# Patient Record
Sex: Female | Born: 1975 | Race: White | Hispanic: No | Smoking: Current every day smoker
Health system: Southern US, Community
[De-identification: ages and names within clinical notes are randomized; demographics above are authoritative.]

## PROBLEM LIST (undated history)

## (undated) DIAGNOSIS — E0590 Thyrotoxicosis, unspecified without thyrotoxic crisis or storm: Secondary | ICD-10-CM

## (undated) DIAGNOSIS — I341 Nonrheumatic mitral (valve) prolapse: Secondary | ICD-10-CM

## (undated) DIAGNOSIS — K859 Acute pancreatitis without necrosis or infection, unspecified: Secondary | ICD-10-CM

## (undated) HISTORY — PX: PARTIAL HYSTERECTOMY: SHX80

## (undated) HISTORY — PX: CHOLECYSTECTOMY: SHX55

---

## 2015-02-05 ENCOUNTER — Encounter (HOSPITAL_COMMUNITY): Payer: Self-pay

## 2015-02-05 ENCOUNTER — Emergency Department (HOSPITAL_COMMUNITY): Payer: Self-pay

## 2015-02-05 ENCOUNTER — Emergency Department (HOSPITAL_COMMUNITY)
Admission: EM | Admit: 2015-02-05 | Discharge: 2015-02-05 | Disposition: A | Discharge status: Home / Self Care | Payer: Self-pay | Attending: Emergency Medicine | Admitting: Emergency Medicine

## 2015-02-05 DIAGNOSIS — Y9289 Other specified places as the place of occurrence of the external cause: Secondary | ICD-10-CM | POA: Insufficient documentation

## 2015-02-05 DIAGNOSIS — Y9389 Activity, other specified: Secondary | ICD-10-CM | POA: Insufficient documentation

## 2015-02-05 DIAGNOSIS — Z720 Tobacco use: Secondary | ICD-10-CM | POA: Insufficient documentation

## 2015-02-05 DIAGNOSIS — E0590 Thyrotoxicosis, unspecified without thyrotoxic crisis or storm: Secondary | ICD-10-CM | POA: Insufficient documentation

## 2015-02-05 DIAGNOSIS — S0990XA Unspecified injury of head, initial encounter: Secondary | ICD-10-CM | POA: Insufficient documentation

## 2015-02-05 DIAGNOSIS — Z79899 Other long term (current) drug therapy: Secondary | ICD-10-CM | POA: Insufficient documentation

## 2015-02-05 DIAGNOSIS — Y998 Other external cause status: Secondary | ICD-10-CM | POA: Insufficient documentation

## 2015-02-05 DIAGNOSIS — M79602 Pain in left arm: Secondary | ICD-10-CM

## 2015-02-05 DIAGNOSIS — I341 Nonrheumatic mitral (valve) prolapse: Secondary | ICD-10-CM | POA: Insufficient documentation

## 2015-02-05 DIAGNOSIS — W19XXXA Unspecified fall, initial encounter: Secondary | ICD-10-CM

## 2015-02-05 DIAGNOSIS — W010XXA Fall on same level from slipping, tripping and stumbling without subsequent striking against object, initial encounter: Secondary | ICD-10-CM | POA: Insufficient documentation

## 2015-02-05 DIAGNOSIS — S199XXA Unspecified injury of neck, initial encounter: Secondary | ICD-10-CM | POA: Insufficient documentation

## 2015-02-05 DIAGNOSIS — S4991XA Unspecified injury of right shoulder and upper arm, initial encounter: Secondary | ICD-10-CM | POA: Insufficient documentation

## 2015-02-05 DIAGNOSIS — M542 Cervicalgia: Secondary | ICD-10-CM

## 2015-02-05 DIAGNOSIS — Z8719 Personal history of other diseases of the digestive system: Secondary | ICD-10-CM | POA: Insufficient documentation

## 2015-02-05 HISTORY — DX: Nonrheumatic mitral (valve) prolapse: I34.1

## 2015-02-05 HISTORY — DX: Acute pancreatitis without necrosis or infection, unspecified: K85.90

## 2015-02-05 HISTORY — DX: Thyrotoxicosis, unspecified without thyrotoxic crisis or storm: E05.90

## 2015-02-05 MED ORDER — HYDROCODONE-ACETAMINOPHEN 5-325 MG PO TABS
1.0000 | ORAL_TABLET | Freq: Four times a day (QID) | ORAL | Status: AC | PRN
Start: 2015-02-05 — End: ?

## 2015-02-05 MED ORDER — CYCLOBENZAPRINE HCL 10 MG PO TABS: 10.0000 mg | ORAL_TABLET | Freq: Two times a day (BID) | ORAL | Status: AC | PRN

## 2015-02-05 MED ORDER — NAPROXEN 500 MG PO TABS: 500.0000 mg | ORAL_TABLET | Freq: Two times a day (BID) | ORAL | Status: AC

## 2015-02-05 NOTE — ED Notes (Signed)
Pt c/o bilateral arm and posterior neck pain after a slip and fall x 2 days ago.  Pain score 8/10.  Pt reports numbness and tingling in bilateral hands.  Pt reports taking OTC medication w/o relief.

## 2015-02-05 NOTE — Discharge Instructions (Signed)
Results of the CT scans and x-rays provided to you. Make an appointment to follow-up with neurosurgery. Take the Naprosyn on a regular basis. Take Flexeril as directed. Take hydrocodone as needed. Return for any new or worse symptoms.

## 2015-02-05 NOTE — ED Notes (Signed)
Pt reported slipping and falling 2 days ago and having increase generalized pain. Pt denies hitting head, LOC, nausea and visual disturbances. Pt reported lightheadedness at the time but denies lightheadedness at this time. (+)PMS, CRT brisk, no deformity/bruising/swelling noted to BUE/BLE.

## 2015-02-05 NOTE — ED Provider Notes (Signed)
CSN: [id number]     Arrival date & time 02/05/15  4098 History   First MD Initiated Contact with Patient 02/05/15 [phone]     Chief Complaint  Patient presents with  . Fall  . Arm Pain  . Neck Pain     (Consider location/radiation/quality/duration/timing/severity/associated sxs/prior Treatment) Patient is a 39 y.o. female presenting with fall, arm pain, and neck pain. The history is provided by the patient.  Fall Associated symptoms include headaches. Pertinent negatives include no chest pain, no abdominal pain and no shortness of breath.  Arm Pain Associated symptoms include headaches. Pertinent negatives include no chest pain, no abdominal pain and no shortness of breath.  Neck Pain Associated symptoms: headaches, numbness and weakness   Associated symptoms: no chest pain and no fever    patient with fall 2 days ago slipped on concrete landing on left side denies hitting head or any loss of consciousness. No nausea vomiting no visual disturbances. Patient reported lightheadedness initially but denies any now. Patient stating that she's got tingling in her right arm even though she fell on the left side pain to left forearm and arm and some in the shoulder. Also now describing neck pain and a headache describes numbness to the right fingers and states that they don't feel quite right as far as their strength. No lower extremity symptoms. No chest pain no shortness of breath no abdominal pain. No back pain. Patient recently moved to the area from [place].  Past Medical History  Diagnosis Date  . Mitral valve prolapse   . Hyperthyroidism   . Pancreatitis    Past Surgical History  Procedure Laterality Date  . Cholecystectomy    . Cesarean section    . Partial hysterectomy     History reviewed. No pertinent family history. History  Substance Use Topics  . Smoking status: Current Every Day Smoker -- 1.00 packs/day    Types: Cigarettes  . Smokeless tobacco: Not on file  . Alcohol Use:  No   OB History    No data available     Review of Systems  Constitutional: Negative for fever.  HENT: Negative for congestion.   Eyes: Negative for visual disturbance.  Respiratory: Negative for shortness of breath.   Cardiovascular: Negative for chest pain.  Gastrointestinal: Negative for abdominal pain.  Genitourinary: Negative for dysuria.  Musculoskeletal: Positive for neck pain.  Skin: Negative for wound.  Neurological: Positive for weakness, numbness and headaches.      Allergies  Zantac and Sulfa antibiotics  Home Medications   Prior to Admission medications   Medication Sig Start Date End Date Taking? Authorizing Provider  Acetaminophen-Aspirin [place] (EXCEDRIN BACK & BODY PO) Take 2 tablets by mouth 3 (three) times daily as needed (pain).   Yes Historical Provider, MD  buprenorphine-naloxone (SUBOXONE) 8-2 MG SUBL SL tablet Place 1 tablet under the tongue daily.   Yes Historical Provider, MD  gabapentin (NEURONTIN) 100 MG capsule Take 200 mg by mouth 3 (three) times daily.   Yes Historical Provider, MD  levothyroxine (SYNTHROID, LEVOTHROID) 137 MCG tablet Take 137 mcg by mouth daily before breakfast.   Yes Historical Provider, MD  propranolol (INDERAL) 40 MG tablet Take 40 mg by mouth 2 (two) times daily.   Yes Historical Provider, MD  ranitidine (ZANTAC) 150 MG tablet Take 150 mg by mouth daily as needed for heartburn.   Yes Historical Provider, MD  cyclobenzaprine (FLEXERIL) 10 MG tablet Take 1 tablet (10 mg total) by mouth 2 (two) times  daily as needed for muscle spasms. 02/05/15   Vanetta Mulders, MD  HYDROcodone-acetaminophen (NORCO/VICODIN) 5-325 MG per tablet Take 1-2 tablets by mouth every 6 (six) hours as needed for moderate pain. 02/05/15   Vanetta Mulders, MD  naproxen (NAPROSYN) 500 MG tablet Take 1 tablet (500 mg total) by mouth 2 (two) times daily. 02/05/15   Vanetta Mulders, MD   BP 97/65 mmHg  Pulse 58  Temp(Src) 98 F (36.7 C) (Oral)  Resp 18  SpO2  100% Physical Exam  Constitutional: She is oriented to person, place, and time. She appears well-developed and well-nourished. No distress.  HENT:  Head: Normocephalic and atraumatic.  Mouth/Throat: Oropharynx is clear and moist.  Eyes: Conjunctivae and EOM are normal. Pupils are equal, round, and reactive to light.  Neck: Normal range of motion. Neck supple.  Cardiovascular: Normal rate and normal heart sounds.   No murmur heard. Pulmonary/Chest: Effort normal and breath sounds normal. No respiratory distress.  Abdominal: Soft. Bowel sounds are normal. There is no tenderness.  Musculoskeletal: Normal range of motion. She exhibits edema and tenderness.  No mild swelling to the left wrist some tenderness to palpation to left humerus left forearm no snuffbox tenderness.  Right hand with good motor function left hand with good motor function. Patient describing tingling to the right hand however no evidence of any motor sensory deficit.  Neurological: She is alert and oriented to person, place, and time. No cranial nerve deficit. She exhibits normal muscle tone. Coordination normal.  Skin: Skin is warm. No rash noted.  Nursing note and vitals reviewed.   ED Course  Procedures (including critical care time) Labs Review Labs Reviewed - No data to display  Imaging Review Dg Forearm Left  02/05/2015   CLINICAL DATA:  Pt reports she slipped and fell at work on her left side x2 days ago. Pt reports pain that radiates from distal end of left metacarpals, up left arm and left shoulder. Pt reports its radiating around the back of her neck and down her rt arm into her rt hand. Pt also reports swelling in right hand  EXAM: LEFT FOREARM - 2 VIEW  COMPARISON:  None.  FINDINGS: No fracture. No bone lesion. Wrist and elbow joints are normally aligned. Soft tissues are unremarkable.  IMPRESSION: Negative.   Electronically Signed   By: Amie Portland M.D.   On: 02/05/2015 11:17   Dg Wrist Complete  Left  02/05/2015   CLINICAL DATA:  Pt reports she slipped and fell at work on her left side x2 days ago. Pt reports pain that radiates from distal end of left metacarpals, up left arm and left shoulder. Pt reports its radiating around the back of her neck and down her rt arm into her rt hand. Pt also reports swelling in right hand.  EXAM: LEFT WRIST - COMPLETE 3+ VIEW  COMPARISON:  None.  FINDINGS: There is no evidence of fracture or dislocation. There is no evidence of arthropathy or other focal bone abnormality. Soft tissues are unremarkable.  IMPRESSION: Negative.   Electronically Signed   By: Amie Portland M.D.   On: 02/05/2015 11:28   Ct Head Wo Contrast  02/05/2015   CLINICAL DATA:  Bilateral arm, posterior cervical spine pain after slipped and fell 2 days ago, numbness and tingling in both can't  EXAM: CT HEAD WITHOUT CONTRAST  CT CERVICAL SPINE WITHOUT CONTRAST  TECHNIQUE: Multidetector CT imaging of the head and cervical spine was performed following the standard protocol without intravenous  contrast. Multiplanar CT image reconstructions of the cervical spine were also generated.  COMPARISON:  None.  FINDINGS: CT HEAD FINDINGS  No mass lesion. No midline shift. No acute hemorrhage or hematoma. No extra-axial fluid collections. No evidence of acute infarction. No skull fracture.  CT CERVICAL SPINE FINDINGS  Normal alignment with no paraspinous hematoma. There is moderate degenerative disc disease at C5-6 with a disc bulge causing mild canal narrowing. There is moderate degenerative disc disease at C6-7 with a disc bulge causing mild canal narrowing. At C7, the bilateral pedicles demonstrate vertical fractures. The margins of the fractures appear at least somewhat sclerotic. There are small cervical ribs bilaterally at C7 as well.  There is generalized reversed lordosis. No acute soft tissue abnormalities. Lung apices clear.  IMPRESSION: 1. Negative head CT 2. Multilevel cervical spine canal narrowing due  to degenerative disc disease and disc bulges. 3. Fractures of the bilateral C7 pedicles age uncertain. There is at least some degree of sclerosis involving the margins of the fracture suggesting that fractures may not necessarily be acute.   Electronically Signed   By: Esperanza Heir M.D.   On: 02/05/2015 11:12   Ct Cervical Spine Wo Contrast  02/05/2015   CLINICAL DATA:  Bilateral arm, posterior cervical spine pain after slipped and fell 2 days ago, numbness and tingling in both can't  EXAM: CT HEAD WITHOUT CONTRAST  CT CERVICAL SPINE WITHOUT CONTRAST  TECHNIQUE: Multidetector CT imaging of the head and cervical spine was performed following the standard protocol without intravenous contrast. Multiplanar CT image reconstructions of the cervical spine were also generated.  COMPARISON:  None.  FINDINGS: CT HEAD FINDINGS  No mass lesion. No midline shift. No acute hemorrhage or hematoma. No extra-axial fluid collections. No evidence of acute infarction. No skull fracture.  CT CERVICAL SPINE FINDINGS  Normal alignment with no paraspinous hematoma. There is moderate degenerative disc disease at C5-6 with a disc bulge causing mild canal narrowing. There is moderate degenerative disc disease at C6-7 with a disc bulge causing mild canal narrowing. At C7, the bilateral pedicles demonstrate vertical fractures. The margins of the fractures appear at least somewhat sclerotic. There are small cervical ribs bilaterally at C7 as well.  There is generalized reversed lordosis. No acute soft tissue abnormalities. Lung apices clear.  IMPRESSION: 1. Negative head CT 2. Multilevel cervical spine canal narrowing due to degenerative disc disease and disc bulges. 3. Fractures of the bilateral C7 pedicles age uncertain. There is at least some degree of sclerosis involving the margins of the fracture suggesting that fractures may not necessarily be acute.   Electronically Signed   By: Esperanza Heir M.D.   On: 02/05/2015 11:12   Dg  Humerus Left  02/05/2015   CLINICAL DATA:  Pt reports she slipped and fell at work on her left side x2 days ago. Pt reports pain that radiates from distal end of left metacarpals, up left arm and left shoulder. Pt reports its radiating around the back of her neck and down her rt arm into her rt hand. Pt also reports swelling in right hand.  EXAM: LEFT HUMERUS - 2+ VIEW  COMPARISON:  None.  FINDINGS: There is no evidence of fracture or other focal bone lesions. Soft tissues are unremarkable.  IMPRESSION: Negative.   Electronically Signed   By: Amie Portland M.D.   On: 02/05/2015 11:13     EKG Interpretation None      MDM   Final diagnoses:  Fall  Arm  pain, diffuse, left  Neck pain    Patient knew to the area on initial presentation seemed to maybe have some slurred speech. Some concern whether there may be a narcotic problem. Patient's workup shows evidence of remote C7 fracture patient doesn't really have a history that seems to fit into that. Other acute injuries predominantly surrounding the left arm without any significant findings. There was no snuffbox tenderness. Patient talked about some neurological tingling to the right hand however motor is completely intact and sensation seems to be intact. Patient given referral to neurosurgery for further evaluation will be treated symptomatically. Patient not showing any evidence of a cord syndrome.    Vanetta Mulders, MD 02/05/15 1239

## 2015-06-14 ENCOUNTER — Emergency Department (HOSPITAL_COMMUNITY): Payer: Managed Care, Other (non HMO)

## 2015-06-14 ENCOUNTER — Emergency Department (HOSPITAL_COMMUNITY)
Admission: EM | Admit: 2015-06-14 | Discharge: 2015-06-14 | Disposition: A | Discharge status: Home / Self Care | Payer: Managed Care, Other (non HMO) | Attending: Emergency Medicine | Admitting: Emergency Medicine

## 2015-06-14 ENCOUNTER — Encounter (HOSPITAL_COMMUNITY): Payer: Self-pay

## 2015-06-14 DIAGNOSIS — R0602 Shortness of breath: Secondary | ICD-10-CM | POA: Diagnosis not present

## 2015-06-14 DIAGNOSIS — F419 Anxiety disorder, unspecified: Secondary | ICD-10-CM | POA: Diagnosis not present

## 2015-06-14 DIAGNOSIS — F17210 Nicotine dependence, cigarettes, uncomplicated: Secondary | ICD-10-CM | POA: Diagnosis not present

## 2015-06-14 DIAGNOSIS — Z8719 Personal history of other diseases of the digestive system: Secondary | ICD-10-CM | POA: Insufficient documentation

## 2015-06-14 DIAGNOSIS — R002 Palpitations: Secondary | ICD-10-CM | POA: Diagnosis not present

## 2015-06-14 DIAGNOSIS — Z791 Long term (current) use of non-steroidal anti-inflammatories (NSAID): Secondary | ICD-10-CM | POA: Diagnosis not present

## 2015-06-14 DIAGNOSIS — Z8679 Personal history of other diseases of the circulatory system: Secondary | ICD-10-CM | POA: Insufficient documentation

## 2015-06-14 DIAGNOSIS — R079 Chest pain, unspecified: Secondary | ICD-10-CM

## 2015-06-14 DIAGNOSIS — E0590 Thyrotoxicosis, unspecified without thyrotoxic crisis or storm: Secondary | ICD-10-CM | POA: Insufficient documentation

## 2015-06-14 DIAGNOSIS — Z79899 Other long term (current) drug therapy: Secondary | ICD-10-CM | POA: Insufficient documentation

## 2015-06-14 DIAGNOSIS — R42 Dizziness and giddiness: Secondary | ICD-10-CM | POA: Insufficient documentation

## 2015-06-14 DIAGNOSIS — R110 Nausea: Secondary | ICD-10-CM | POA: Insufficient documentation

## 2015-06-14 DIAGNOSIS — Z79891 Long term (current) use of opiate analgesic: Secondary | ICD-10-CM | POA: Insufficient documentation

## 2015-06-14 LAB — BASIC METABOLIC PANEL
ANION GAP: 7 (ref 5–15)
BUN: 10 mg/dL (ref 6–20)
CALCIUM: 9.6 mg/dL (ref 8.9–10.3)
CO2: 25 mmol/L (ref 22–32)
Chloride: 107 mmol/L (ref 101–111)
Creatinine, Ser: 0.61 mg/dL (ref 0.44–1.00)
GFR calc non Af Amer: >60 mL/min (ref >60)
Glucose, Bld: 128 mg/dL — ABNORMAL HIGH (ref 65–99)
Potassium: 3.7 mmol/L (ref 3.5–5.1)
SODIUM: 139 mmol/L (ref 135–145)

## 2015-06-14 LAB — CBC
HEMATOCRIT: 39.9 % (ref 36.0–46.0)
Hemoglobin: 13.5 g/dL (ref 12.0–15.0)
MCH: 32.5 pg (ref 26.0–34.0)
MCHC: 33.8 g/dL (ref 30.0–36.0)
MCV: 95.9 fL (ref 78.0–100.0)
Platelets: 242 10*3/uL (ref 150–400)
RBC: 4.16 MIL/uL (ref 3.87–5.11)
RDW: 12.3 % (ref 11.5–15.5)
WBC: 8.9 10*3/uL (ref 4.0–10.5)

## 2015-06-14 LAB — I-STAT TROPONIN, ED: Troponin i, poc: 0 ng/mL (ref 0.00–0.08)

## 2015-06-14 LAB — D-DIMER, QUANTITATIVE (NOT AT ARMC): D DIMER QUANT: 0.43 ug{FEU}/mL (ref 0.00–0.50)

## 2015-06-14 NOTE — Discharge Instructions (Signed)
Please read and follow all provided instructions.  Your diagnoses today include:  1. Chest pain, unspecified chest pain type    Tests performed today include:  An EKG of your heart  A chest x-ray  Cardiac enzymes - a blood test for heart muscle damage  Blood counts and electrolytes  D-dimer - screening test for blood clot and this was normal  Vital signs. See below for your results today.   Medications prescribed:   None  Take any prescribed medications only as directed.  Follow-up instructions: Please follow-up with your primary care provider as soon as you can for further evaluation of your symptoms.   Return instructions:  SEEK IMMEDIATE MEDICAL ATTENTION IF:  You have severe chest pain, especially if the pain is crushing or pressure-like and spreads to the arms, back, neck, or jaw, or if you have sweating, nausea (feeling sick to your stomach), or shortness of breath. THIS IS AN EMERGENCY. Don't wait to see if the pain will go away. Get medical help at once. Call 911 or 0 (operator). DO NOT drive yourself to the hospital.   Your chest pain gets worse and does not go away with rest.   You have an attack of chest pain lasting longer than usual, despite rest and treatment with the medications your caregiver has prescribed.   You wake from sleep with chest pain or shortness of breath.  You feel dizzy or faint.  You have chest pain not typical of your usual pain for which you originally saw your caregiver.   You have any other emergent concerns regarding your health.  Additional Information: Chest pain comes from many different causes. Your caregiver has diagnosed you as having chest pain that is not specific for one problem, but does not require admission.  You are at low risk for an acute heart condition or other serious illness.   Your vital signs today were: BP 103/69 mmHg   Pulse 58   Temp(Src) 98 F (36.7 C) (Oral)   Resp 16   SpO2 100% If your blood pressure  (BP) was elevated above 135/85 this visit, please have this repeated by your doctor within one month. --------------   Emergency Department Resource Guide 1) Find a Doctor and Pay Out of Pocket Although you won't have to find out who is covered by your insurance plan, it is a good idea to ask around and get recommendations. You will then need to call the office and see if the doctor you have chosen will accept you as a new patient and what types of options they offer for patients who are self-pay. Some doctors offer discounts or will set up payment plans for their patients who do not have insurance, but you will need to ask so you aren't surprised when you get to your appointment.  2) Contact Your Local Health Department Not all health departments have doctors that can see patients for sick visits, but many do, so it is worth a call to see if yours does. If you don't know where your local health department is, you can check in your phone book. The CDC also has a tool to help you locate your state's health department, and many state websites also have listings of all of their local health departments.  3) Find a Walk-in Clinic If your illness is not likely to be very severe or complicated, you may want to try a walk in clinic. These are popping up all over the country in pharmacies, drugstores,  and shopping centers. They're usually staffed by nurse practitioners or physician assistants that have been trained to treat common illnesses and complaints. They're usually fairly quick and inexpensive. However, if you have serious medical issues or chronic medical problems, these are probably not your best option.  No Primary Care Doctor: - Call Health Connect at  [phone] - they can help you locate a primary care doctor that  accepts your insurance, provides certain services, etc. - Physician Referral Service- [phone]  Chronic Pain Problems: Organization         Address  Phone   Notes  [facility]  [phone] Patients need to be referred by their primary care doctor.   Medication Assistance: Organization         Address  Phone   Notes  [facility] Med[facility] [address] [place] [place], [place] [address] [phone] --Must be a resident of [facility] -- Must have NO insurance coverage whatsoever (no Medicaid/ Medicare, etc.) -- The pt. MUST have a primary care doctor that directs their care regularly and follows them in the community   MedAssist  [phone]   [employer]  [phone]    Agencies that provide inexpensive medical care: Organization         Address  Phone   Notes  [facility]  [phone]   [facility]    [phone]   [facility] [address] [place], [place] [address] [phone]   [facility] [address] [place] [place] [phone]   Planned Parenthood    [phone]   [facility]    [phone]   [facility] and [facility]  [address], [place] Phone:  [phone], Fax:  [phone] Hours of Operation:  9 am - 6 pm, M-F.  Also accepts Medicaid/Medicare and self-pay.  [facility]  [address], [place] Phone: [phone], Fax: [phone]. Hours of Operation:  8:30 am - 5:30 pm, M-F.  Also accepts Medicaid and self-pay.  [facility] [place] [address], [place] Phone: [phone]   [facility] [address] Natasha Bence [place], [place] [phone], Ext. [phone] Mondays & Thursdays: 7-9 AM.  First 15 patients are seen on a first come, first serve basis.    Medicaid-accepting [facility] Providers:  Organization         Address  Phone   Notes  [facility] [address], [place] [phone] Also accepts self-pay patients.  [facility] [address]  Laurell Josephs [place], [place]  [phone]   [facility] [address], [place] [phone]   [facility] [address] [place] [phone]   Renaye Rakers [address], [place]   [phone] Only accepts [place] Access [place] patients after they have their name applied to their card.   Self-Pay (no insurance) in [facility]:  Organization         Address  Phone   Notes  Sickle Cell Patients, [facility] [address] [place], [place] [phone]   [facility] [address] [place], [place] [phone]   [facility] [place]  [address], [place] [phone])  [phone]   [facility]/Dr. Julio Sicks  [address] [place] or [address], [place] [phone] Phone number for both [place] and [place] locations is the same.  [facility] [address] [place] [phone]   [facility] [address], [place] or [address] [phone]   [facility] [address] [place] [phone], phone; [phone], fax Sees patients 1st and 3rd Saturday of every month.  Must not qualify for public or private insurance (i.e. Medicaid, Medicare, [place] Health Choice, Veterans' Benefits)  Household income should be no more than 200% of the poverty level The clinic cannot treat you if you are pregnant or think you are pregnant  Sexually transmitted diseases are not treated at the clinic.    Dental Care: Organization         Address  Phone  Notes  [facility] [address] [place], [place] [phone] Accepts children up to age 28 who are enrolled in [place] or [place] Health Choice; pregnant women with a Medicaid card; and children who have applied for Medicaid  or [place] Health Choice, but were declined, whose parents can pay a reduced fee at time of service.  [facility] Department of [facility]  [address], [place] [phone] Accepts children up to age 32 who are enrolled in [place] or [place] Health Choice; pregnant women with a Medicaid card; and children who have applied for Medicaid or [place] Health Choice, but were declined, whose parents can pay a reduced fee at time of service.  [facility]  [address] [place], [place] [phone] Patients are seen by appointment only. Walk-ins are not accepted. [facility] will see patients 72 years of age and older. Monday - Tuesday (8am-5pm) Most Wednesdays (8:30-5pm) $30 per visit, cash only  [facility] Adult Dental Access PROGRAM  [address], [facility] [phone] Patients are seen by appointment only. Walk-ins are not accepted. [facility] will see patients 41 years of age and older. One Wednesday Evening (Monthly: Volunteer Based).  $30 per visit, cash only  [employer]  [phone] for adults; Children under age 35, call Graduate Pediatric Dentistry at [phone]. Children aged 68-14, please call [phone] to request a pediatric application.  Dental services are provided in all areas of dental care including fillings, crowns and bridges, complete and partial dentures, implants, gum treatment, root canals, and extractions. Preventive care is also provided. Treatment is provided to both adults and children. Patients are selected via a lottery and there is often a waiting list.   [facility] [address] [place]  [phone] [email or web address]   [facility] [address] [place], [place] [phone], Ext. [phone] Second and Fourth Thursday of each month, opens at 6:30 AM; Clinic ends at 9 AM.  Patients are seen on a first-come first-served basis, and a limited number are seen  during each clinic.   [facility]  [address] Ether Griffins [place], [place] [phone]   Eligibility Requirements You must have lived in [place], [place], or [place] counties for at least the last three months.   You cannot be eligible for state or federal sponsored [place], including [employer], [place], or [employer].   You generally cannot be eligible  for healthcare insurance through your employer.    How to apply: Eligibility screenings are held every Tuesday and Wednesday afternoon from 1:00 pm until 4:00 pm. You do not need an appointment for the interview!  [facility] [address], [place], [place] [phone]   [facility]  [phone]   [facility] Department  [phone]   [facility] Health Department  [phone]    Behavioral Health Resources in the Community: Intensive Outpatient Programs Organization         Address  Phone  Notes  [facility] [address] [place], [place] [phone]   [facility] Outpatient [address], [place], [place] [phone]   [facility] [address] [place], [place]  [phone]   [facility] [address],  [place], [place] [phone] or [phone]   Substance Abuse Resources Organization         Address  Phone  Notes  [facility] Drug Services  [phone]   [facility]  [phone]   [facility]  [phone]   Floydene Flock  [phone]   Residential & Outpatient Substance Abuse Program  [phone]   Psychological Services Organization         Address  Phone  Notes  [facility]  [phone]   [facility] Services  [phone]   [facility] [address], [place] [phone] or [phone]    Mobile Crisis Teams Organization         Address  Phone  Notes  [facility],  Mobile Crisis Care Unit  [phone]   [facility]  [address] [place] [employer], [place] [phone]   Doristine Locks [address] [place] [phone]    Self-Help/Support Groups Organization         Address  Phone             Notes  [facility] [place] - variety of support groups  336- I7437963 Call for more information  Narcotics Anonymous (NA), [facility] [address], [employer] [place]  2 meetings at this location   Statistician         Address  Phone  Notes  [facility] [address],    [place]  [phone]   [facility]  [address] [place] [address], [place], [place] [phone]   [facility] [address] [place], [place] [phone] Admissions: 8am-3pm M-F  [facility] [address]    [place], [place] [phone]   [facility] [address] [place], [place], [place] [phone]   [facility] [address],  [place], [place] [phone]   [facility] - Intensive Outpatient [address] Laure[address] Joseph[address], [place], [place] [phone]   [facility] ([facility]iction Recovery [facility] [address] [place]  [place], [place] [phone] or [phone]   [facility] ([facility]) [address] [place], [place] [phone] Accepts Medicaid  [facility] [address]  [place] [phone] Substance Abuse/Addiction Treatment   [facility] Organization         Address  Phone  Notes  [facility]  [phone]   Angie Fava, PhD [address] Ervin Knack [place], [place]   [phone] or [phone]   [facility]   [address] [place], [place] (  [phone]) [phone]   [facility] [address], [place], [place] [phone]) [phone] Insurance/Medicaid/sponsorship through [employer]  [facility] [address]                                    [place], [place] [phone]) [phone] Therapy/tele-psych/case  [facility] [address]   [place], [place] [phone](336) [phone]    Dr. Lolly MustacheArfeen  [phone]   [facility] [place] [facility]  [employer] [facility] 1) [address], [place] 2) [address], [place] 3)  [address], [place] [phone](336) [phone]  [phone](336) [phone]   [facility] Child Abuse Hotline 4170528639(336) [phone] or [phone](336) [phone] (After Hours)

## 2015-06-14 NOTE — ED Notes (Signed)
Pt. BIB EMS for complaint of central CP with radiation to L side. Pt. Endorses nausea/dizziness/diarrhea/chills/HA. Pt. With hx of hypothyroidism and is on beta blocker for mitral valve prolapse. EMS reports no orthostatic changes.   Pt. Given 4 MG zofran, 324 ASA, and 1 Nitro.

## 2015-06-14 NOTE — ED Provider Notes (Signed)
CSN: [id number]     Arrival date & time 06/14/15  1732 History   First MD Initiated Contact with Patient 06/14/15 1801     Chief Complaint  Patient presents with  . Chest Pain     (Consider location/radiation/quality/duration/timing/severity/associated sxs/prior Treatment) HPI Comments: Patient presents with complaint of chest pain. Patient states that she was at home getting ready for work when she suddenly became nauseous, very lightheaded without full syncope and developed chest pain. Pain described as sharp. Pain radiated to her left shoulder and back. It was associated with shortness of breath and feeling of slow heart rate. Patient takes a beta blocker. Symptoms gradually resolved but returned again afterwards. She called EMS who administered Zofran, aspirin, nitroglycerin. Symptoms have eased. Patient denies drug use or alcohol. No fever or cough. Patient denies risk factors for pulmonary embolism including: unilateral leg swelling, history of DVT/PE/other blood clots, use of estrogens, recent immobilizations, recent surgery, recent travel (>4hr segment), malignancy, hemoptysis.     Patient is a 39 y.o. female presenting with chest pain. The history is provided by the patient.  Chest Pain Associated symptoms: nausea, palpitations and shortness of breath   Associated symptoms: no abdominal pain, no back pain, no cough, no diaphoresis, no fever and not vomiting     Past Medical History  Diagnosis Date  . Mitral valve prolapse   . Hyperthyroidism   . Pancreatitis    Past Surgical History  Procedure Laterality Date  . Cholecystectomy    . Cesarean section    . Partial hysterectomy     No family history on file. Social History  Substance Use Topics  . Smoking status: Current Every Day Smoker -- 1.00 packs/day    Types: Cigarettes  . Smokeless tobacco: None  . Alcohol Use: No   OB History    No data available     Review of Systems  Constitutional: Negative for fever and  diaphoresis.  Eyes: Negative for redness.  Respiratory: Positive for shortness of breath. Negative for cough.   Cardiovascular: Positive for chest pain and palpitations. Negative for leg swelling.  Gastrointestinal: Positive for nausea. Negative for vomiting and abdominal pain.  Genitourinary: Negative for dysuria.  Musculoskeletal: Negative for back pain and neck pain.  Skin: Negative for rash.  Neurological: Negative for syncope and light-headedness.  Psychiatric/Behavioral: The patient is nervous/anxious.       Allergies  Zantac and Sulfa antibiotics  Home Medications   Prior to Admission medications   Medication Sig Start Date End Date Taking? Authorizing Provider  Acetaminophen-Aspirin [place] (EXCEDRIN BACK & BODY PO) Take 2 tablets by mouth 3 (three) times daily as needed (pain).    Historical Provider, MD  buprenorphine-naloxone (SUBOXONE) 8-2 MG SUBL SL tablet Place 1 tablet under the tongue daily.    Historical Provider, MD  cyclobenzaprine (FLEXERIL) 10 MG tablet Take 1 tablet (10 mg total) by mouth 2 (two) times daily as needed for muscle spasms. 02/05/15   Vanetta MuldersScott Zackowski, MD  gabapentin (NEURONTIN) 100 MG capsule Take 200 mg by mouth 3 (three) times daily.    Historical Provider, MD  HYDROcodone-acetaminophen (NORCO/VICODIN) 5-325 MG per tablet Take 1-2 tablets by mouth every 6 (six) hours as needed for moderate pain. 02/05/15   Vanetta MuldersScott Zackowski, MD  levothyroxine (SYNTHROID, LEVOTHROID) 137 MCG tablet Take 137 mcg by mouth daily before breakfast.    Historical Provider, MD  naproxen (NAPROSYN) 500 MG tablet Take 1 tablet (500 mg total) by mouth 2 (two) times daily. 02/05/15  Vanetta Mulders, MD  propranolol (INDERAL) 40 MG tablet Take 40 mg by mouth 2 (two) times daily.    Historical Provider, MD  ranitidine (ZANTAC) 150 MG tablet Take 150 mg by mouth daily as needed for heartburn.    Historical Provider, MD   BP 93/65 mmHg  Pulse 60  Temp(Src) 98 F (36.7 C) (Oral)  Resp  19  SpO2 99%   Physical Exam  Constitutional: She appears well-developed and well-nourished.  HENT:  Head: Normocephalic and atraumatic.  Mouth/Throat: Mucous membranes are normal. Mucous membranes are not dry.  Eyes: Conjunctivae are normal.  Neck: Trachea normal and normal range of motion. Neck supple. Normal carotid pulses and no JVD present. No muscular tenderness present. Carotid bruit is not present. No tracheal deviation present.  Cardiovascular: Normal rate, regular rhythm, S1 normal, S2 normal, normal heart sounds and intact distal pulses.  Exam reveals no decreased pulses.   No murmur heard. Pulmonary/Chest: Effort normal. No respiratory distress. She has no wheezes. She exhibits no tenderness.  Abdominal: Soft. Normal aorta and bowel sounds are normal. There is no tenderness. There is no rebound and no guarding.  Musculoskeletal: Normal range of motion.  Neurological: She is alert.  Skin: Skin is warm and dry. She is not diaphoretic. No cyanosis. No pallor.  Psychiatric: She has a normal mood and affect.  Nursing note and vitals reviewed.   ED Course  Procedures (including critical care time) Labs Review Labs Reviewed  BASIC METABOLIC PANEL - Abnormal; Notable for the following:    Glucose, Bld 128 (*)    All other components within normal limits  CBC  D-DIMER, QUANTITATIVE (NOT AT [facility])  Rosezena Sensor, ED    Imaging Review Dg Chest 2 View  06/14/2015  CLINICAL DATA:  Chest pain and dizziness EXAM: CHEST  2 VIEW COMPARISON:  None. FINDINGS: Lungs are clear. Heart size and pulmonary vascularity are normal. No adenopathy. No bone lesions. No pneumothorax. IMPRESSION: No edema or consolidation. Electronically Signed   By: Bretta Bang III M.D.   On: 06/14/2015 18:05   I have personally reviewed and evaluated these images and lab results as part of my medical decision-making.   EKG Interpretation   Date/Time:  Wednesday June 14 2015 17:37:15  EST Ventricular Rate:  61 PR Interval:  171 QRS Duration: 113 QT Interval:  413 QTC Calculation: 416 R Axis:   49 Text Interpretation:  Sinus rhythm Borderline intraventricular conduction  delay RSR' in V1 or V2, probably normal variant Baseline wander in lead(s)  III No previous ECGs available Confirmed by Bebe Shaggy  MD, DONALD ([id number])  on 06/14/2015 5:53:04 PM       6:46 PM Patient seen and examined. Work-up initiated. Medications ordered.   Vital signs reviewed and are as follows: BP 93/65 mmHg  Pulse 60  Temp(Src) 98 F (36.7 C) (Oral)  Resp 19  SpO2 99%  8:54 PM Patient states that she is feeling better. She's been up to the restroom without feeling dizzy. She is currently symptomatic. We discussed her workup tonight and that this is largely negative. Encouraged PCP follow-up for recheck. Return to the emergency department with worsening symptoms including no change in chest pain, shortness of breath, syncope, or any other concerns. She verbalizes understanding and agrees with plan.  MDM   Final diagnoses:  Chest pain, unspecified chest pain type   Patient with 2 episodes of chest pain, now resolved. She is low risk for acute coronary syndrome. D-dimer was negative. No orthostasis  prior to discharge. No findings suggestive of myocarditis, endocarditis. She appears well. Feel that she can be safely discharged home. Return instructions as above. PCP referrals given.    Renne Crigler, PA-C 06/14/15 2059  Zadie Rhine, MD 06/14/15 [phone]

## 2017-01-03 IMAGING — CT CT CERVICAL SPINE W/O CM
3 of 5 series · 13 of 33 positions shown, 16 images · non-contrast
Comparison: None.

CLINICAL DATA: Bilateral arm, posterior cervical spine pain after
slipped and fell 2 days ago, numbness and tingling in both can't

EXAM:
CT HEAD WITHOUT CONTRAST
CT CERVICAL SPINE WITHOUT CONTRAST
TECHNIQUE: Multidetector CT imaging of the head and cervical spine was
performed following the standard protocol without intravenous
contrast. Multiplanar CT image reconstructions of the cervical spine
were also generated.

[Series 5: c-spine st · axial · 0.26mm/px · z∈[-277,-175]mm · 5 of 77 slices shown, 7 images]
[im 13/77  soft-tissue]
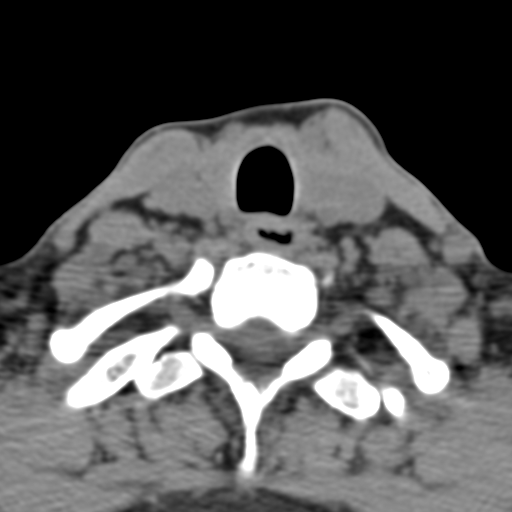
[im 13/77  bone]
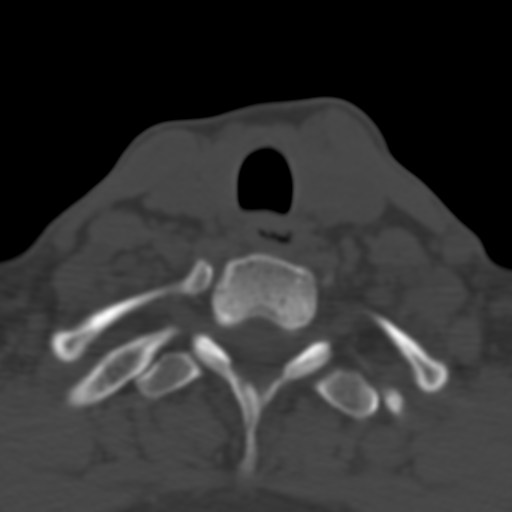
[im 26/77  bone]
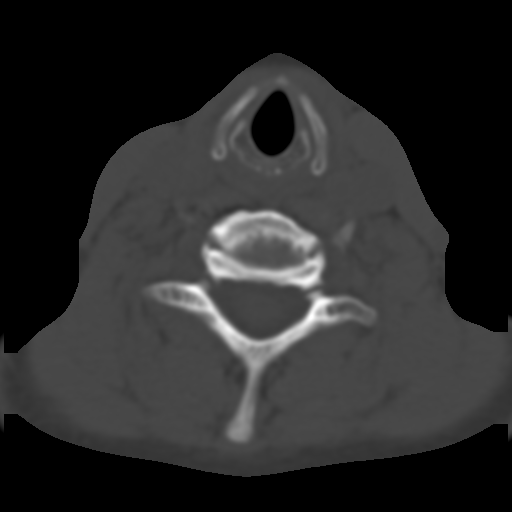
[im 39/77  bone]
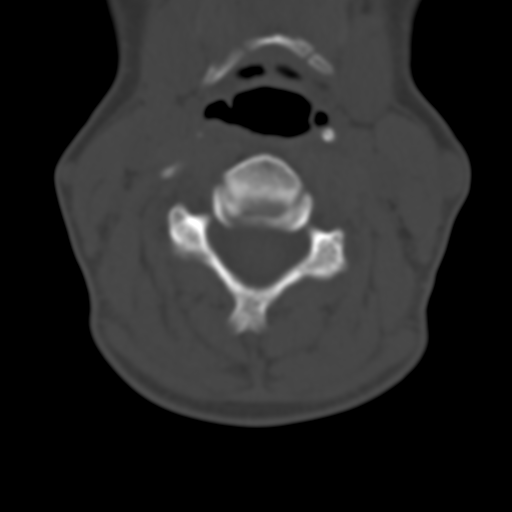
[im 51/77  bone]
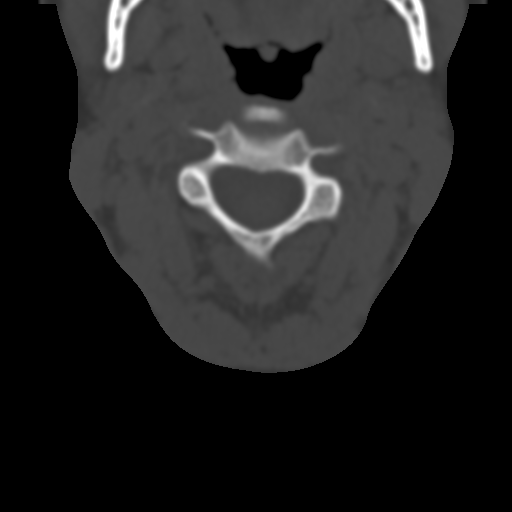
[im 64/77  soft-tissue]
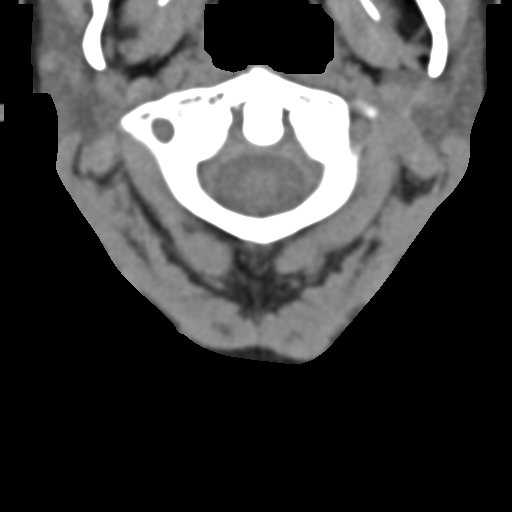
[im 64/77  bone]
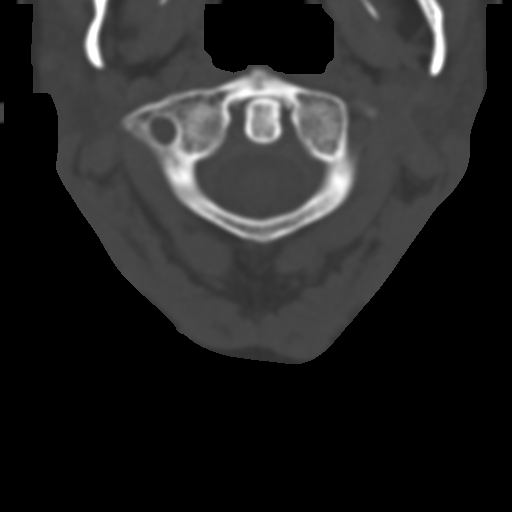

[Series 9: coronal · coronal · 0.23mm/px · 3 of 38 slices shown]
[im 8/38  bone]
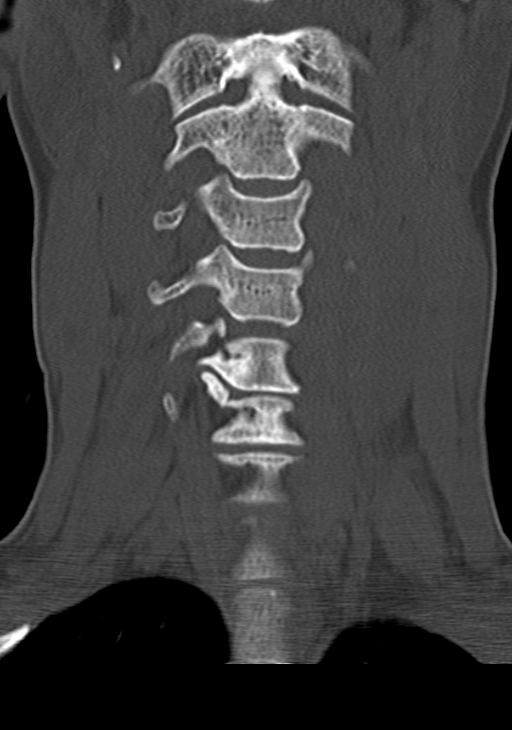
[im 15/38  bone]
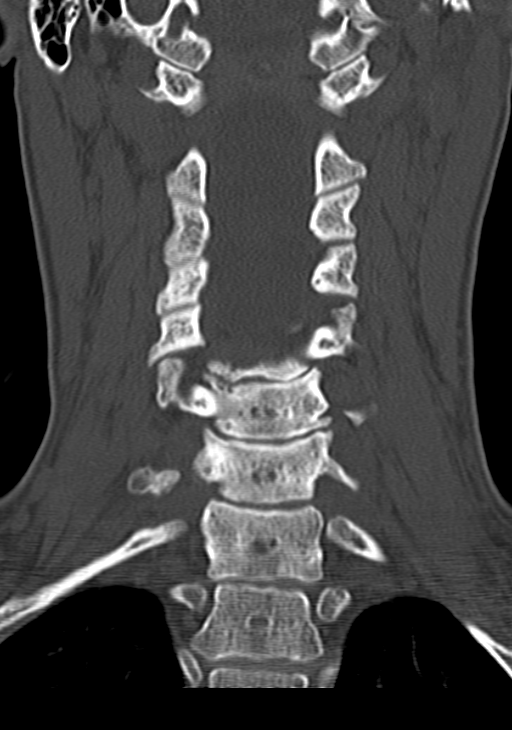
[im 23/38  bone]
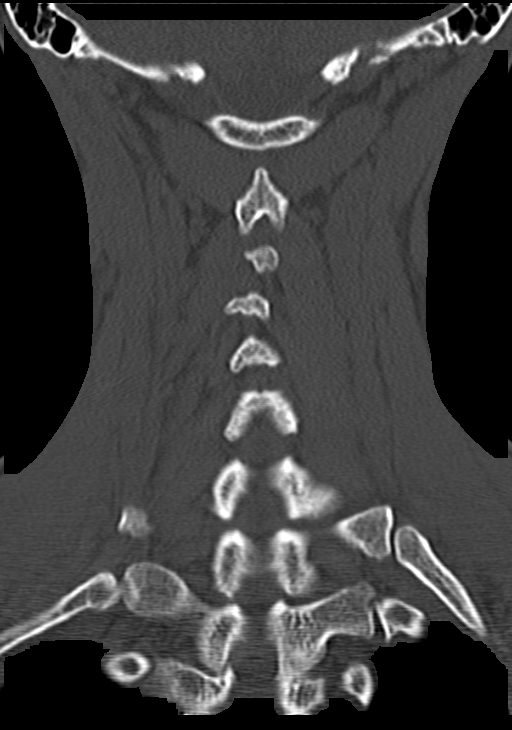

[Series 10: sagittal · sagittal · 0.27mm/px · 5 of 30 slices shown, 6 images]
[im 10/30  bone]
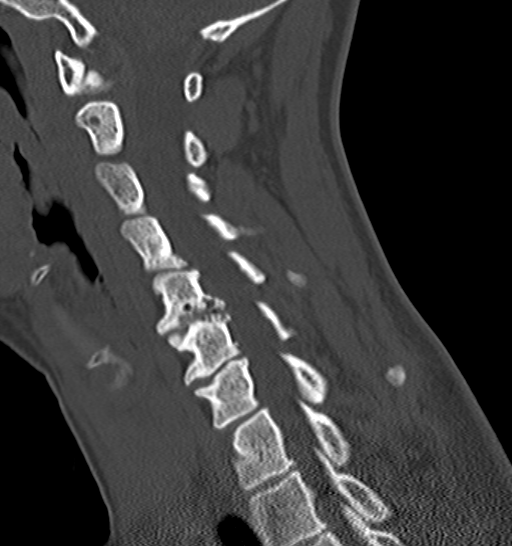
[im 13/30  bone]
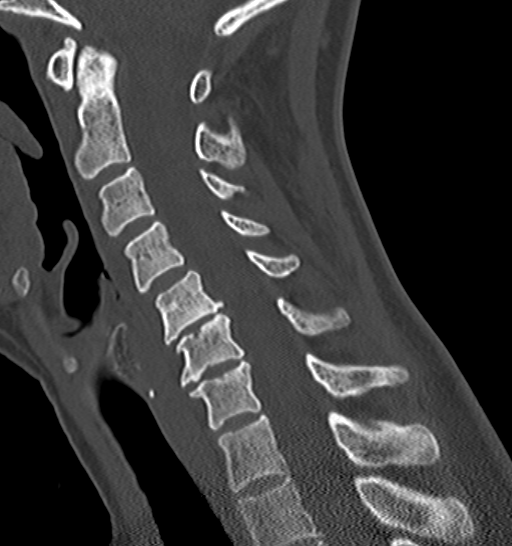
[im 15/30  soft-tissue]
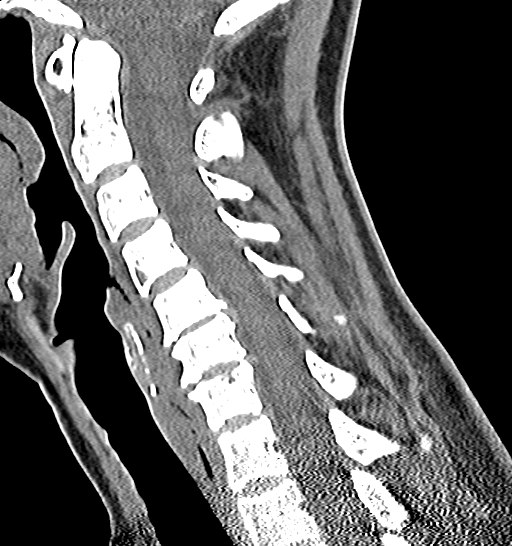
[im 15/30  bone]
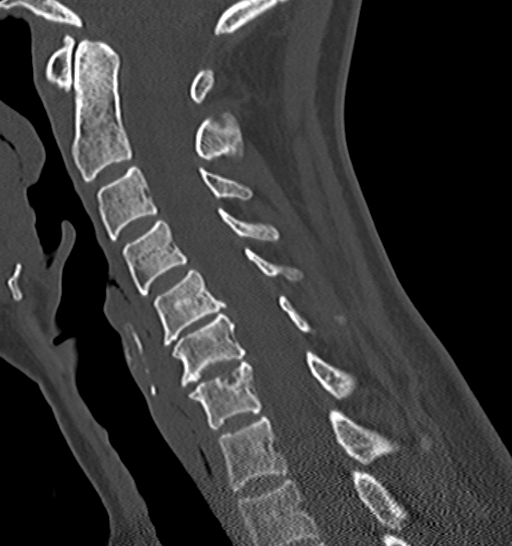
[im 17/30  bone]
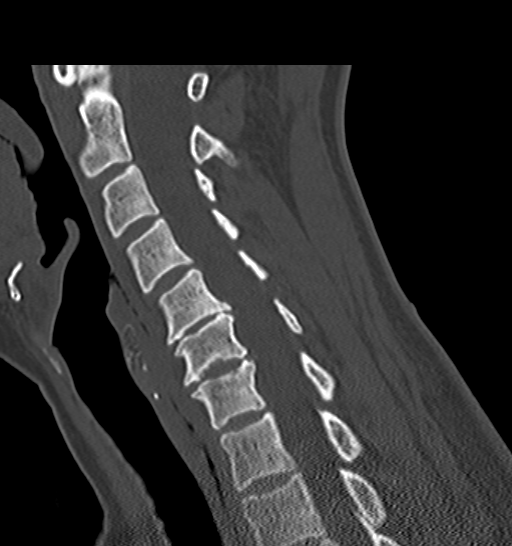
[im 20/30  bone]
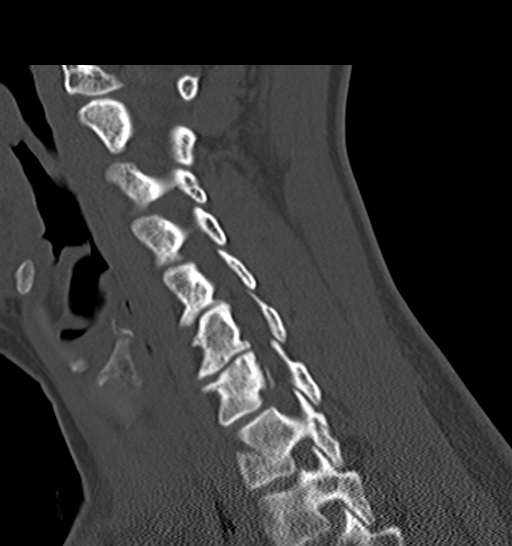

[13 of 33 positions shown; findings below may reference images not displayed]

FINDINGS: CT HEAD FINDINGS

No mass lesion. No midline shift. No acute hemorrhage or hematoma.
No extra-axial fluid collections. No evidence of acute infarction.
No skull fracture.

CT CERVICAL SPINE FINDINGS

Normal alignment with no paraspinous hematoma. There is moderate
degenerative disc disease at C5-6 with a disc bulge causing mild
canal narrowing. There is moderate degenerative disc disease at C6-7
with a disc bulge causing mild canal narrowing. At C7, the bilateral
pedicles demonstrate vertical fractures. The margins of the
fractures appear at least somewhat sclerotic. There are small
cervical ribs bilaterally at C7 as well.

There is generalized reversed lordosis. No acute soft tissue
abnormalities. Lung apices clear.
IMPRESSION: 1. Negative head CT
2. Multilevel cervical spine canal narrowing due to degenerative
disc disease and disc bulges.
3. Fractures of the bilateral C7 pedicles age uncertain. There is at
least some degree of sclerosis involving the margins of the fracture
suggesting that fractures may not necessarily be acute.
